# Patient Record
Sex: Male | Born: 1985 | Race: White | Hispanic: No | Marital: Single | Smoking: Current every day smoker
Health system: Southern US, Community
[De-identification: ages and names within clinical notes are randomized; demographics above are authoritative.]

## PROBLEM LIST (undated history)

## (undated) DIAGNOSIS — M1990 Unspecified osteoarthritis, unspecified site: Secondary | ICD-10-CM

## (undated) HISTORY — PX: APPENDECTOMY: SHX54

---

## 2012-12-24 ENCOUNTER — Encounter: Payer: Self-pay | Admitting: Family Medicine

## 2013-01-03 ENCOUNTER — Encounter: Payer: Self-pay | Admitting: Family Medicine

## 2014-05-13 ENCOUNTER — Ambulatory Visit: Payer: Self-pay | Admitting: Orthopedic Surgery

## 2016-08-03 ENCOUNTER — Encounter: Payer: Self-pay | Admitting: *Deleted

## 2016-08-03 ENCOUNTER — Emergency Department
Admission: EM | Admit: 2016-08-03 | Discharge: 2016-08-03 | Disposition: A | Discharge status: Home / Self Care | Payer: BLUE CROSS/BLUE SHIELD | Attending: Emergency Medicine | Admitting: Emergency Medicine

## 2016-08-03 ENCOUNTER — Emergency Department: Payer: BLUE CROSS/BLUE SHIELD

## 2016-08-03 DIAGNOSIS — S62521A Displaced fracture of distal phalanx of right thumb, initial encounter for closed fracture: Secondary | ICD-10-CM | POA: Diagnosis not present

## 2016-08-03 DIAGNOSIS — Y999 Unspecified external cause status: Secondary | ICD-10-CM | POA: Diagnosis not present

## 2016-08-03 DIAGNOSIS — Y9339 Activity, other involving climbing, rappelling and jumping off: Secondary | ICD-10-CM | POA: Diagnosis not present

## 2016-08-03 DIAGNOSIS — Y92488 Other paved roadways as the place of occurrence of the external cause: Secondary | ICD-10-CM | POA: Diagnosis not present

## 2016-08-03 DIAGNOSIS — X501XXA Overexertion from prolonged static or awkward postures, initial encounter: Secondary | ICD-10-CM | POA: Insufficient documentation

## 2016-08-03 DIAGNOSIS — S93401A Sprain of unspecified ligament of right ankle, initial encounter: Secondary | ICD-10-CM | POA: Insufficient documentation

## 2016-08-03 DIAGNOSIS — S6991XA Unspecified injury of right wrist, hand and finger(s), initial encounter: Secondary | ICD-10-CM | POA: Diagnosis present

## 2016-08-03 MED ORDER — NAPROXEN 500 MG PO TABS: 500.0000 mg | ORAL_TABLET | Freq: Two times a day (BID) | ORAL | 0 refills | Status: DC

## 2016-08-03 NOTE — ED Provider Notes (Signed)
[facility] Emergency Department Provider Note  ____________________________________________  Time seen: Approximately 10:49 AM  I have reviewed the triage vital signs and the nursing notes.   HISTORY  Chief Complaint Ankle Pain    HPI Kirk Larsen is a 30 y.o. male , NAD, presents emergency department for evaluation of right ankle pain and right thumb pain. Patient states he hurt his right thumb approximately one week ago. Is on certain of the specific injury but believes he may have hit something while he was working. Has had pain and bruising along the swelling about the distal portion of his thumb since that time. Denies any numbness, weakness or tingling. Has not noted any redness, bleeding or oozing. Also notes that he twisted his right ankle last night while jumping over an object in the road. States he came down and his lateral right ankle went down to the ground. Felt and heard a pop. Has noted swelling and bruising this morning. Denies any numbness, weakness or tingling of the lower extremity and has not noted any open wounds or lacerations.   History reviewed. No pertinent past medical history.  There are no active problems to display for this patient.   No past surgical history on file.  Prior to Admission medications   Medication Sig Start Date End Date Taking? Authorizing Provider  naproxen (NAPROSYN) 500 MG tablet Take 1 tablet (500 mg total) by mouth 2 (two) times daily with a meal. 08/03/16   Jalan Fariss L Kohl Polinsky, PA-C    Allergies Ivp dye [iodinated diagnostic agents]  History reviewed. No pertinent family history.  Social History Social History  Substance Use Topics  . Smoking status: Not on file  . Smokeless tobacco: Not on file  . Alcohol use Not on file     Review of Systems  Constitutional: No fever/chills Musculoskeletal: Positive right ankle and right thumb pain. Negative for back nor neck pain.  Skin: Positive bruising and  swelling right thumb and ankle. Negative for rash, redness, abnormal warmth, open wounds or lacerations. No oozing, weeping or bleeding. Neurological: Negative for numbness, weakness, tingling.   ____________________________________________   PHYSICAL EXAM:  VITAL SIGNS: ED Triage Vitals [08/03/16 0831]  Enc Vitals Group     BP (!) 156/88     Pulse Rate 71     Resp 18     Temp 98.4 F (36.9 C)     Temp Source Oral     SpO2 99 %     Weight 215 lb (97.5 kg)     Height 6\' 2"  (1.88 m)     Head Circumference      Peak Flow      Pain Score 8     Pain Loc      Pain Edu?      Excl. in GC?      Constitutional: Alert and oriented. Well appearing and in no acute distress. Eyes: Conjunctivae are normal without icterus or injection Head: Atraumatic. Cardiovascular: Good peripheral circulation with 2+ pulses noted in the right upper and lower extremities. Capillary refill is brisk in all digits of the right hand and foot. Respiratory: Normal respiratory effort without tachypnea or retractions.  Musculoskeletal: Tenderness to palpation diffusely about the middle and distal phalanx of the right thumb. Decreased range of motion of the PIP of the right thumb but full range of motion of the MCP. Patient is able to make a fist without difficulty. Diffuse swelling about the right ankle with mild bruising and tenderness  to palpation about the lateral and anterolateral surface. No bony abnormalities or crepitus. No laxity with anterior or posterior drawer. Laxity of varus stress. Full range of motion of the foot and toes without pain or difficulty. Neurologic:  Normal speech and language. No gross focal neurologic deficits are appreciated. Sensation to light touch grossly intact about the right upper and lower extremities. Skin:  Skin is warm, dry and intact. No rash noted. Psychiatric: Mood and affect are normal. Speech and behavior are normal. Patient exhibits appropriate insight and  judgement.   ____________________________________________   LABS  None ____________________________________________  EKG  None ____________________________________________  RADIOLOGY I, Ernestene KielJami L Garnet Chatmon, personally viewed and evaluated these images (plain radiographs) as part of my medical decision making, as well as reviewing the written report by the radiologist.  Dg Ankle Complete Right  Result Date: 08/03/2016 CLINICAL DATA:  Injured jumping, turning ankle with pain medially EXAM: RIGHT ANKLE - COMPLETE 3+ VIEW COMPARISON:  None. FINDINGS: The ankle joint appears normal. Alignment is normal. No fracture is seen. IMPRESSION: Negative. Electronically Signed   By: Dwyane DeePaul  Barry M.D.   On: 08/03/2016 08:56   Dg Finger Thumb Right  Result Date: 08/03/2016 CLINICAL DATA:  Hit something at work last week with pain EXAM: RIGHT THUMB 2+V COMPARISON:  None. FINDINGS: There is a nondisplaced fracture through the base of the distal phalanx of the right thumb with the fracture apparently extending to involve the right first DIP joint. No other acute abnormality is seen. Joint spaces appear normal. IMPRESSION: Nondisplaced fracture through the base of the distal phalanx of the right thumb which extends intra-articular. Electronically Signed   By: Dwyane DeePaul  Barry M.D.   On: 08/03/2016 08:57    ____________________________________________    PROCEDURES  Procedure(s) performed: None   Procedures   Medications - No data to display   ____________________________________________   INITIAL IMPRESSION / ASSESSMENT AND PLAN / ED COURSE  Pertinent labs & imaging results that were available during my care of the patient were reviewed by me and considered in my medical decision making (see chart for details).  Clinical Course     Patient's diagnosis is consistent with Right ankle sprain and closed displaced fracture of the distal phalanx of the right thumb. Patient's right ankle is placed in  Ace wrap and given crutches for comfort care. Right thumb was splinted in a metal finger splint. Patient will be discharged home with prescriptions for naproxen to take as directed. Patient is to follow up with Dr. Martha ClanKrasinski in orthopedics in 3-5 days for further evaluation and treatment of thumb fracture. Patient is given ED precautions to return to the ED for any worsening or new symptoms.    ____________________________________________  FINAL CLINICAL IMPRESSION(S) / ED DIAGNOSES  Final diagnoses:  Sprain of right ankle, unspecified ligament, initial encounter  Closed displaced fracture of distal phalanx of right thumb, initial encounter      NEW MEDICATIONS STARTED DURING THIS VISIT:  Discharge Medication List as of 08/03/2016  9:04 AM    START taking these medications   Details  naproxen (NAPROSYN) 500 MG tablet Take 1 tablet (500 mg total) by mouth 2 (two) times daily with a meal., Starting Wed 08/03/2016, Print             Ernestene KielJami L SimlaHagler, PA-C 08/03/16 1233    Emily FilbertJonathan E Williams, MD 08/03/16 [phone]

## 2016-08-03 NOTE — ED Triage Notes (Signed)
States he rolled his ankle last night, ambulatory to triage, using cane, states he hurt his thumb last week too

## 2016-08-03 NOTE — ED Notes (Signed)
Pt verbalized understanding of discharge teaching. 

## 2017-09-20 ENCOUNTER — Other Ambulatory Visit: Payer: Self-pay | Admitting: Orthopedic Surgery

## 2017-09-20 DIAGNOSIS — M23203 Derangement of unspecified medial meniscus due to old tear or injury, right knee: Secondary | ICD-10-CM

## 2017-09-26 ENCOUNTER — Ambulatory Visit
Admission: RE | Admit: 2017-09-26 | Discharge: 2017-09-26 | Disposition: A | Discharge status: Home / Self Care | Payer: BLUE CROSS/BLUE SHIELD | Source: Ambulatory Visit | Attending: Orthopedic Surgery | Admitting: Orthopedic Surgery

## 2017-09-26 DIAGNOSIS — M23203 Derangement of unspecified medial meniscus due to old tear or injury, right knee: Secondary | ICD-10-CM

## 2017-09-26 DIAGNOSIS — M23221 Derangement of posterior horn of medial meniscus due to old tear or injury, right knee: Secondary | ICD-10-CM | POA: Diagnosis not present

## 2017-09-26 DIAGNOSIS — R937 Abnormal findings on diagnostic imaging of other parts of musculoskeletal system: Secondary | ICD-10-CM | POA: Insufficient documentation

## 2017-10-31 ENCOUNTER — Encounter: Payer: Self-pay | Admitting: *Deleted

## 2017-10-31 ENCOUNTER — Other Ambulatory Visit: Payer: Self-pay

## 2017-11-07 ENCOUNTER — Ambulatory Visit
Admission: RE | Admit: 2017-11-07 | Discharge: 2017-11-07 | Disposition: A | Discharge status: Home / Self Care | Payer: BLUE CROSS/BLUE SHIELD | Source: Ambulatory Visit | Attending: Orthopedic Surgery | Admitting: Orthopedic Surgery

## 2017-11-07 ENCOUNTER — Ambulatory Visit: Payer: BLUE CROSS/BLUE SHIELD | Admitting: Anesthesiology

## 2017-11-07 ENCOUNTER — Encounter
Admission: RE | Disposition: A | Discharge status: Home / Self Care | Payer: Self-pay | Source: Ambulatory Visit | Attending: Orthopedic Surgery

## 2017-11-07 DIAGNOSIS — F17210 Nicotine dependence, cigarettes, uncomplicated: Secondary | ICD-10-CM | POA: Diagnosis not present

## 2017-11-07 DIAGNOSIS — M25561 Pain in right knee: Secondary | ICD-10-CM | POA: Diagnosis present

## 2017-11-07 DIAGNOSIS — X58XXXA Exposure to other specified factors, initial encounter: Secondary | ICD-10-CM | POA: Diagnosis not present

## 2017-11-07 DIAGNOSIS — S83231A Complex tear of medial meniscus, current injury, right knee, initial encounter: Secondary | ICD-10-CM | POA: Insufficient documentation

## 2017-11-07 HISTORY — PX: KNEE ARTHROSCOPY WITH MENISCAL REPAIR: SHX5653

## 2017-11-07 HISTORY — DX: Unspecified osteoarthritis, unspecified site: M19.90

## 2017-11-07 SURGERY — ARTHROSCOPY, KNEE, WITH MENISCUS REPAIR
Anesthesia: Regional | Laterality: Right | Wound class: Clean

## 2017-11-07 MED ORDER — LIDOCAINE-EPINEPHRINE 1 %-1:100000 IJ SOLN
INTRAMUSCULAR | Status: DC | PRN
  Administered 2017-11-07: 30 mL

## 2017-11-07 MED ORDER — OXYCODONE HCL 5 MG PO TABS: 5.0000 mg | ORAL_TABLET | Freq: Once | ORAL | Status: DC | PRN

## 2017-11-07 MED ORDER — OXYCODONE HCL 5 MG/5ML PO SOLN: 5.0000 mg | Freq: Once | ORAL | Status: DC | PRN

## 2017-11-07 MED ORDER — ROPIVACAINE HCL 5 MG/ML IJ SOLN
INTRAMUSCULAR | Status: DC | PRN
  Administered 2017-11-07: 30 mL via PERINEURAL

## 2017-11-07 MED ORDER — LIDOCAINE HCL (CARDIAC) 20 MG/ML IV SOLN
INTRAVENOUS | Status: DC | PRN
  Administered 2017-11-07: 80 mg via INTRATRACHEAL

## 2017-11-07 MED ORDER — LACTATED RINGERS IV SOLN
INTRAVENOUS | Status: DC
  Administered 2017-11-07: 09:00:00 via INTRAVENOUS

## 2017-11-07 MED ORDER — DEXAMETHASONE SODIUM PHOSPHATE 4 MG/ML IJ SOLN
INTRAMUSCULAR | Status: DC | PRN
  Administered 2017-11-07: 6 mg via INTRAVENOUS

## 2017-11-07 MED ORDER — FENTANYL CITRATE (PF) 100 MCG/2ML IJ SOLN
INTRAMUSCULAR | Status: DC | PRN
  Administered 2017-11-07 (×4): 25 ug via INTRAVENOUS

## 2017-11-07 MED ORDER — ONDANSETRON 4 MG PO TBDP: 4.0000 mg | ORAL_TABLET | Freq: Three times a day (TID) | ORAL | 0 refills | Status: DC | PRN

## 2017-11-07 MED ORDER — LACTATED RINGERS IV SOLN
INTRAVENOUS | Status: DC
  Administered 2017-11-07: 08:00:00 via INTRAVENOUS

## 2017-11-07 MED ORDER — HYDROCODONE-ACETAMINOPHEN 5-325 MG PO TABS: 1.0000 | ORAL_TABLET | ORAL | 0 refills | Status: DC | PRN

## 2017-11-07 MED ORDER — GLYCOPYRROLATE 0.2 MG/ML IJ SOLN
INTRAMUSCULAR | Status: DC | PRN
  Administered 2017-11-07: 0.1 mg via INTRAVENOUS

## 2017-11-07 MED ORDER — PROPOFOL 10 MG/ML IV BOLUS
INTRAVENOUS | Status: DC | PRN
  Administered 2017-11-07: 200 mg via INTRAVENOUS

## 2017-11-07 MED ORDER — BUPIVACAINE HCL 0.5 % IJ SOLN
INTRAMUSCULAR | Status: DC | PRN
  Administered 2017-11-07: 30 mL

## 2017-11-07 MED ORDER — ONDANSETRON HCL 4 MG/2ML IJ SOLN
INTRAMUSCULAR | Status: DC | PRN
  Administered 2017-11-07: 4 mg via INTRAVENOUS

## 2017-11-07 MED ORDER — ASPIRIN EC 325 MG PO TBEC: 325.0000 mg | DELAYED_RELEASE_TABLET | Freq: Every day | ORAL | 0 refills | Status: AC

## 2017-11-07 MED ORDER — IBUPROFEN 800 MG PO TABS: 800.0000 mg | ORAL_TABLET | Freq: Three times a day (TID) | ORAL | 0 refills | Status: AC

## 2017-11-07 MED ORDER — HYDROMORPHONE HCL 1 MG/ML IJ SOLN: 0.2500 mg | INTRAMUSCULAR | Status: DC | PRN

## 2017-11-07 MED ORDER — DEXTROSE 5 % IV SOLN
2000.0000 mg | Freq: Once | INTRAVENOUS | Status: AC
  Administered 2017-11-07: 2000 mg via INTRAVENOUS

## 2017-11-07 SURGICAL SUPPLY — 43 items
ADAPTER IRRIG TUBE 2 SPIKE SOL (ADAPTER) ×4 IMPLANT
BLADE SURG 15 STRL LF DISP TIS (BLADE) ×1 IMPLANT
BLADE SURG 15 STRL SS (BLADE) ×1
BLADE SURG SZ11 CARB STEEL (BLADE) ×2 IMPLANT
BNDG COHESIVE 4X5 TAN STRL (GAUZE/BANDAGES/DRESSINGS) ×2 IMPLANT
BNDG ESMARK 6X12 TAN STRL LF (GAUZE/BANDAGES/DRESSINGS) IMPLANT
BRACE KNEE POST OP SHORT (BRACE) IMPLANT
BUR RADIUS 3.5 (BURR) ×2 IMPLANT
BUR RADIUS 4.0X18.5 (BURR) IMPLANT
CHLORAPREP W/TINT 26ML (MISCELLANEOUS) ×2 IMPLANT
CLEANER CAUTERY TIP 5X5 PAD (MISCELLANEOUS) ×1 IMPLANT
COOLER POLAR GLACIER W/PUMP (MISCELLANEOUS) ×2 IMPLANT
COVER LIGHT HANDLE UNIVERSAL (MISCELLANEOUS) ×4 IMPLANT
CUFF TOURN SGL QUICK 24 (TOURNIQUET CUFF) ×1
CUFF TOURN SGL QUICK 30 (MISCELLANEOUS)
CUFF TOURN SGL QUICK 34 (TOURNIQUET CUFF)
CUFF TRNQT CYL 24X4X40X1 (TOURNIQUET CUFF) ×1 IMPLANT
CUFF TRNQT CYL 34X4X40X1 (TOURNIQUET CUFF) IMPLANT
CUFF TRNQT CYL LO 30X4X (MISCELLANEOUS) IMPLANT
DRAPE IMP U-DRAPE 54X76 (DRAPES) ×2 IMPLANT
GAUZE SPONGE 4X4 12PLY STRL (GAUZE/BANDAGES/DRESSINGS) ×2 IMPLANT
GLOVE BIO SURGEON STRL SZ7.5 (GLOVE) ×2 IMPLANT
GLOVE BIOGEL PI IND STRL 8 (GLOVE) ×1 IMPLANT
GLOVE BIOGEL PI INDICATOR 8 (GLOVE) ×1
GOWN STRL REUS W/ TWL LRG LVL3 (GOWN DISPOSABLE) ×1 IMPLANT
GOWN STRL REUS W/TWL LRG LVL3 (GOWN DISPOSABLE) ×3 IMPLANT
IV LACTATED RINGER IRRG 3000ML (IV SOLUTION) ×4
IV LR IRRIG 3000ML ARTHROMATIC (IV SOLUTION) ×4 IMPLANT
KIT TURNOVER KIT A (KITS) ×2 IMPLANT
MAT BLUE FLOOR 46X72 FLO (MISCELLANEOUS) ×2 IMPLANT
NEPTUNE MANIFOLD (MISCELLANEOUS) ×2 IMPLANT
PACK ARTHROSCOPY KNEE (MISCELLANEOUS) ×2 IMPLANT
PAD CLEANER CAUTERY TIP 5X5 (MISCELLANEOUS) ×1
PAD WRAPON POLAR KNEE (MISCELLANEOUS) ×1 IMPLANT
PENCIL ELECTRO HAND CTR (MISCELLANEOUS) IMPLANT
SET TUBE SUCT SHAVER OUTFL 24K (TUBING) ×2 IMPLANT
SET TUBE TIP INTRA-ARTICULAR (MISCELLANEOUS) ×2 IMPLANT
SUT ETHILON 3-0 KS 30 BLK (SUTURE) ×2 IMPLANT
TOWEL OR 17X26 4PK STRL BLUE (TOWEL DISPOSABLE) ×4 IMPLANT
TUBING ARTHRO INFLOW-ONLY STRL (TUBING) ×2 IMPLANT
WAND HAND CNTRL MULTIVAC 50 (MISCELLANEOUS) IMPLANT
WAND HAND CNTRL MULTIVAC 90 (MISCELLANEOUS) IMPLANT
WRAPON POLAR PAD KNEE (MISCELLANEOUS) ×2

## 2017-11-07 NOTE — Anesthesia Preprocedure Evaluation (Signed)
Anesthesia Evaluation  Patient identified by MRN, date of birth, ID band Patient awake    Reviewed: Allergy & Precautions, H&P , NPO status , Patient's Chart, lab work & pertinent test results  History of Anesthesia Complications Negative for: history of anesthetic complications  Airway Mallampati: I  TM Distance: >3 FB Neck ROM: full    Dental no notable dental hx.    Pulmonary Current Smoker,    Pulmonary exam normal        Cardiovascular negative cardio ROS Normal cardiovascular exam     Neuro/Psych    GI/Hepatic negative GI ROS, Neg liver ROS,   Endo/Other  negative endocrine ROS  Renal/GU negative Renal ROS     Musculoskeletal   Abdominal   Peds  Hematology negative hematology ROS (+)   Anesthesia Other Findings   Reproductive/Obstetrics                            Anesthesia Physical Anesthesia Plan  ASA: II  Anesthesia Plan: General LMA and Regional   Post-op Pain Management: GA combined w/ Regional for post-op pain   Induction:   PONV Risk Score and Plan:   Airway Management Planned:   Additional Equipment:   Intra-op Plan:   Post-operative Plan:   Informed Consent: I have reviewed the patients History and Physical, chart, labs and discussed the procedure including the risks, benefits and alternatives for the proposed anesthesia with the patient or authorized representative who has indicated his/her understanding and acceptance.     Plan Discussed with:   Anesthesia Plan Comments:         Anesthesia Quick Evaluation

## 2017-11-07 NOTE — H&P (Signed)
Paper H&P to be scanned into permanent record. H&P reviewed. No significant changes noted.  

## 2017-11-07 NOTE — Transfer of Care (Signed)
Immediate Anesthesia Transfer of Care Note  Patient: Kirk Larsen  Procedure(s) Performed: KNEE ARTHROSCOPY WITH MEDIAL MENISCAL REPAIR AND PARTIAL MENISCECTOMY WITH CHONDROPLASTY (Right )  Patient Location: PACU  Anesthesia Type: General LMA, Regional  Level of Consciousness: awake, alert  and patient cooperative  Airway and Oxygen Therapy: Patient Spontanous Breathing and Patient connected to supplemental oxygen  Post-op Assessment: Post-op Vital signs reviewed, Patient's Cardiovascular Status Stable, Respiratory Function Stable, Patent Airway and No signs of Nausea or vomiting  Post-op Vital Signs: Reviewed and stable  Complications: No apparent anesthesia complications

## 2017-11-07 NOTE — Discharge Instructions (Signed)
Arthroscopic Knee Surgery - Partial Meniscectomy °  °Post-Op Instructions °  °1. Bracing or crutches: Crutches will be provided at the time of discharge from the surgery center if you do not already have them. °  °2. Ice: You may be provided with a device (Polar Care) that allows you to ice the affected area effectively. Otherwise you can ice manually.  °  °3. Driving:  Plan on not driving for at least two weeks. Please note that you are advised NOT to drive while taking narcotic pain medications as you may be impaired and unsafe to drive. °  °4. Activity: Ankle pumps several times an hour while awake to prevent blood clots. Weight bearing: as tolerated. Use crutches for as needed (usually ~1 week or less) until pain allows you to ambulate without a limp. Bending and straightening the knee is unlimited. Elevate knee above heart level as much as possible for one week. Avoid standing more than 5 minutes (consecutively) for the first week.  Avoid long distance travel for 2 weeks. °  °5. Medications:  °- You have been provided a prescription for narcotic pain medicine. After surgery, take 1-2 narcotic tablets every 4 hours if needed for severe pain.  °- A prescription for anti-nausea medication will be provided in case the narcotic medicine causes nausea - take 1 tablet every 6 hours only if nauseated.  °- Take ibuprofen 800 mg every 8 hours WITH food to reduce post-operative knee swelling. DO NOT STOP IBUPROFEN POST-OP UNTIL INSTRUCTED TO DO SO at first post-op office visit (10-14 days after surgery). However, please discontinue if you have any abdominal discomfort after taking this.  °- Take enteric coated aspirin 325 mg once daily for 2 weeks to prevent blood clots.  ° ° °6. Bandages: The physical therapist should change the bandages at the first post-op appointment. If needed, the dressing supplies have been provided to you. °  °7. Physical Therapy: 1-2 times per week for 6 weeks. Therapy typically starts on post  operative Day 3 or 4. You have been provided an order for physical therapy. The therapist will provide home exercises. °  °8. Work: May return to full work usually around 2 weeks after 1st post-operative visit. May do light duty/desk job in approximately 1-2 weeks when off of narcotics, pain is well-controlled, and swelling has decreased. Labor intensive jobs may require 4-6 weeks to return.  °  °9. Post-Op Appointments: °Your first post-op appointment will be with Dr. Keeley Sussman in approximately 2 weeks time.  °  °If you find that they have not been scheduled please call the Orthopaedic Appointment front desk at [phone]. ° ° °General Anesthesia, Adult, Care After °These instructions provide you with information about caring for yourself after your procedure. Your health care provider may also give you more specific instructions. Your treatment has been planned according to current medical practices, but problems sometimes occur. Call your health care provider if you have any problems or questions after your procedure. °What can I expect after the procedure? °After the procedure, it is common to have: °· Vomiting. °· A sore throat. °· Mental slowness. ° °It is common to feel: °· Nauseous. °· Cold or shivery. °· Sleepy. °· Tired. °· Sore or achy, even in parts of your body where you did not have surgery. ° °Follow these instructions at home: °For at least 24 hours after the procedure: °· Do not: °? Participate in activities where you could fall or become injured. °? Drive. °? Use heavy machinery. °? Drink alcohol. °? Take sleeping pills or medicines   that cause drowsiness. °? Make important decisions or sign legal documents. °? Take care of children on your own. °· Rest. °Eating and drinking °· If you vomit, drink water, juice, or soup when you can drink without vomiting. °· Drink enough fluid to keep your urine clear or pale yellow. °· Make sure you have little or no nausea before eating solid foods. °· Follow the  diet recommended by your health care provider. °General instructions °· Have a responsible adult stay with you until you are awake and alert. °· Return to your normal activities as told by your health care provider. Ask your health care provider what activities are safe for you. °· Take over-the-counter and prescription medicines only as told by your health care provider. °· If you smoke, do not smoke without supervision. °· Keep all follow-up visits as told by your health care provider. This is important. °Contact a health care provider if: °· You continue to have nausea or vomiting at home, and medicines are not helpful. °· You cannot drink fluids or start eating again. °· You cannot urinate after 8-12 hours. °· You develop a skin rash. °· You have fever. °· You have increasing redness at the site of your procedure. °Get help right away if: °· You have difficulty breathing. °· You have chest pain. °· You have unexpected bleeding. °· You feel that you are having a life-threatening or urgent problem. °This information is not intended to replace advice given to you by your health care provider. Make sure you discuss any questions you have with your health care provider. °Document Released: 11/28/2000 Document Revised: 01/25/2016 Document Reviewed: 08/06/2015 °Elsevier Interactive Patient Education © 2018 [employer] ° °

## 2017-11-07 NOTE — Anesthesia Procedure Notes (Signed)
Anesthesia Regional Block: Adductor canal block   Pre-Anesthetic Checklist: ,, timeout performed, Correct Patient, Correct Site, Correct Laterality, Correct Procedure, Correct Position, site marked, Risks and benefits discussed,  Surgical consent,  Pre-op evaluation,  At surgeon's request and post-op pain management  Laterality: Right  Prep: chloraprep       Needles:  Injection technique: Single-shot  Needle Type: Echogenic Needle     Needle Length: 9cm  Needle Gauge: 21     Additional Needles:   Procedures:,,,, ultrasound used (permanent image in chart),,,,  Narrative:  Start time: 11/07/2017 8:15 AM End time: 11/07/2017 8:30 AM Injection made incrementally with aspirations every 5 mL.  Performed by: Personally  Anesthesiologist: Jolayne Pantherunkle, Javarus Dorner, MD  Additional Notes: Functioning IV was confirmed and monitors applied. Ultrasound guidance: relevant anatomy identified, needle position confirmed, local anesthetic spread visualized around nerve(s)., vascular puncture avoided.  Image printed for medical record.  Negative aspiration and no paresthesias; incremental administration of local anesthetic. The patient tolerated the procedure well. Vitals signes recorded in RN notes.

## 2017-11-07 NOTE — Anesthesia Procedure Notes (Signed)
Procedure Name: LMA Insertion Date/Time: 11/07/2017 9:00 AM Performed by: Orlin HildingLeblanc, Quamere Mussell, CRNA Pre-anesthesia Checklist: Patient identified, Patient being monitored, Timeout performed, Emergency Drugs available and Suction available Patient Re-evaluated:Patient Re-evaluated prior to induction Oxygen Delivery Method: Circle system utilized Preoxygenation: Pre-oxygenation with 100% oxygen Induction Type: IV induction Ventilation: Mask ventilation without difficulty LMA: LMA inserted LMA Size: 4.0 Tube type: Oral Number of attempts: 1 Placement Confirmation: positive ETCO2 and breath sounds checked- equal and bilateral Tube secured with: Tape Dental Injury: Teeth and Oropharynx as per pre-operative assessment

## 2017-11-07 NOTE — Op Note (Signed)
Operative Note   SURGERY DATE: 11/07/2017  PRE-OP DIAGNOSIS:  1. Right medial meniscus tear  POST-OP DIAGNOSIS: 1. Right medial meniscus tear 2. Degenerative changes of patella  PROCEDURES:  1. Right knee arthroscopy, partial medial meniscectomy 2. Chondroplasty of patellofemoral compartment  SURGEON: Rosealee AlbeeSunny H. Kalani Sthilaire, MD  ANESTHESIA: Gen  ESTIMATED BLOOD LOSS:minimal  TOTAL IV FLUIDS: per anesthesia  INDICATION(S):  Kirk Larsen is a 32 y.o. male with signs and symptoms as well as MRI finding of medial meniscus tear. Meniscus tear was initially diagnosed in 2015, but he never underwent surgery due to improvement in symptoms. He reinjured his knee recently and updated MRI redemonstrated tear with more significant involvement of meniscus. We discussed that I would attempt to repair the meniscus, if at all possible, but there was a significant chance that the meniscus would be irreparable given the chronicity of tear. After discussion of risks, benefits, and alternatives to surgery, the patient elected to proceed.  OPERATIVE FINDINGS:   Examination under anesthesia:A careful examination under anesthesia was performed. Passive range of motion was: Hyperextension: 1. Extension: 0. Flexion: 140. Lachman: normal. Pivot Shift: normal. Posterior drawer: normal. Varus stability in full extension: normal. Varus stability in 30 degrees of flexion: normal. Valgus stability in full extension: normal. Valgus stability in 30 degrees of flexion: normal.  Intra-operative findings:A thorough arthroscopic examination of the knee was performed. The findings are: 1. Suprapatellar pouch: Normal 2. Undersurface of median ridge: Normal 3. Medial patellar facet: Focal Grade 2 degenerative changes at medial border 4. Lateral patellar facet: Normal 5. Trochlea: Normal 6. Lateral gutter/popliteus tendon: Normal 7. Hoffa's fat pad: Inflamed 8. Medial gutter/plica: Flipped fragment of  meniscus in medial gutter 9. ACL: Normal 10. PCL: Normal 11. Medial meniscus: Complex tear with both vertical abd horizontal components of the posterior horn with fragment flipped into medial gutter 12. Medial compartment cartilage: Grade 1 degenerative changes of both femoral condyle and tibial plateau 13. Lateral meniscus: Normal 14. Lateral compartment cartilage: Normal  OPERATIVE REPORT:   I identified Kirk Galarik Kaulinisin the pre-operative holding area. I marked theoperativeknee with my initials. I reviewed the risks and benefits of the proposed surgical intervention and the patient (and/or patient's guardian) wished to proceed. The patient was transferred to the operative suite and placed in the supine position with all bony prominences padded. Anesthesia was administered. Appropriate IV antibioticswere administered prior to incision. The extremity was then prepped and draped in standard fashion. A time out was performed confirming the correct extremity, correct patient, and correct procedure.  Arthroscopy portals were marked. Local anesthetic was injected to the planned portal sites. The anterolateral portalwasestablished with an 11blade.   The arthroscope was placed in the anterolateral portal and theninto the suprapatellar pouch. A diagnostic knee scope was completed with the above findings. The medial meniscus tear was identified.  Next the medial portal was established under needle localization. The MCL was pie-crusted to improve visualization of the posterior horn. The meniscal tear was flipped back into the joint and removed from the joint with a combination of a biter and grasper. The remainder of the posterior horn of the medial meniscus was debrided using an arthroscopic biter and an oscillating shaver until the meniscus had stable borders. There was still a significant amount of meniscus rim left. A chondroplasty was performed of the medial patella such that there were  stable cartilage edges without any loose fragments of cartilage. Arthroscopic fluid was removed from the joint.  The portals were closed with 3-0 Nylon  suture. Sterile dressings included Xeroform, 4x4s, Sof-Rol, and Bias wrap. A Polarcare was placed.  The patient was then awakened and taken to the PACU hemodynamically stable without complication.   POSTOPERATIVE PLAN: The patient will be discharged home today once they meet PACU criteria. Aspirin 325 mg daily was prescribed for 2 weeks for DVT prophylaxis. Physical therapy will start on POD#3-4.Weight-bearing as tolerated. They will follow up in 2 weeks per protocol.

## 2017-11-07 NOTE — Anesthesia Postprocedure Evaluation (Signed)
Anesthesia Post Note  Patient: Kirk Larsen  Procedure(s) Performed: KNEE ARTHROSCOPY WITH MEDIAL MENISCAL REPAIR AND PARTIAL MENISCECTOMY WITH CHONDROPLASTY (Right )  Patient location during evaluation: PACU Anesthesia Type: Regional Level of consciousness: awake and alert Pain management: pain level controlled Vital Signs Assessment: post-procedure vital signs reviewed and stable Respiratory status: spontaneous breathing Cardiovascular status: blood pressure returned to baseline Postop Assessment: no headache Anesthetic complications: no    Verner Cholunkle, III,  Akasia Ahmad D

## 2020-12-01 ENCOUNTER — Encounter: Payer: Self-pay | Admitting: Emergency Medicine

## 2020-12-01 ENCOUNTER — Emergency Department: Payer: Medicaid Other

## 2020-12-01 ENCOUNTER — Other Ambulatory Visit: Payer: Self-pay

## 2020-12-01 ENCOUNTER — Emergency Department
Admission: EM | Admit: 2020-12-01 | Discharge: 2020-12-01 | Disposition: A | Discharge status: Home / Self Care | Payer: Medicaid Other | Attending: Emergency Medicine | Admitting: Emergency Medicine

## 2020-12-01 DIAGNOSIS — S63641A Sprain of metacarpophalangeal joint of right thumb, initial encounter: Secondary | ICD-10-CM | POA: Insufficient documentation

## 2020-12-01 DIAGNOSIS — F17210 Nicotine dependence, cigarettes, uncomplicated: Secondary | ICD-10-CM | POA: Diagnosis not present

## 2020-12-01 DIAGNOSIS — X58XXXA Exposure to other specified factors, initial encounter: Secondary | ICD-10-CM | POA: Diagnosis not present

## 2020-12-01 DIAGNOSIS — S6991XA Unspecified injury of right wrist, hand and finger(s), initial encounter: Secondary | ICD-10-CM | POA: Diagnosis present

## 2020-12-01 NOTE — ED Triage Notes (Signed)
Patient presents to the ED with painful thumb on right hand.  Patient states thumb has been hurting for approx. 2-3 weeks when he accidentally injured it.  Patient states he has been wearing a thumb splint but today, re injured his thumb and now the pain is very severe.

## 2020-12-01 NOTE — ED Notes (Signed)
See triage note  Presents with right thumb pain  States he bent back his thumb about 2 weeks ago  And re injured at today

## 2020-12-01 NOTE — Discharge Instructions (Addendum)
Your exam is negative for any acute fracture.  You do have a sprain to the thumb, concerning for a "skiers thumb."  Wear the thumb brace as directed.  You may take over-the-counter Tylenol Motrin needed for pain.  Follow-up with [facility] for ongoing symptom management.

## 2020-12-01 NOTE — ED Provider Notes (Signed)
[facility] Emergency Department Provider Note ____________________________________________  Time seen: 1338  I have reviewed the triage vital signs and the nursing notes.  HISTORY  Chief Complaint  Hand Pain  HPI Kirk Larsen is a 35 y.o. male presents himself to the ED for evaluation of right thumb pain on the right hand.  Patient with report of right hand injury about 3 weeks prior, when he landed on a tented right hand.  He describes a hyperextension injury to the right thumb at that time.  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  He presents today noting continued pain and disability to the right thumb.  He is not sought care with any other provider at this time.  Past Medical History:  Diagnosis Date  . Arthritis    hips, knees    There are no problems to display for this patient.   Past Surgical History:  Procedure Laterality Date  . APPENDECTOMY    . KNEE ARTHROSCOPY WITH MENISCAL REPAIR Right 11/07/2017   Procedure: KNEE ARTHROSCOPY WITH MEDIAL MENISCAL REPAIR AND PARTIAL MENISCECTOMY WITH CHONDROPLASTY;  Surgeon: Signa Kell, MD;  Location: [facility];  Service: Orthopedics;  Laterality: Right;  [place]  - STYKER CETERIX, AIR,  SHARPSHOOTER BRAG BRACE SUPINE WITH ACL LEG HOLDER    Prior to Admission medications   Not on File    Allergies Ivp dye [iodinated diagnostic agents]  History reviewed. No pertinent family history.  Social History Social History   Tobacco Use  . Smoking status: Current Every Day Smoker    Packs/day: 0.50    Years: 10.00    Pack years: 5.00    Types: Cigarettes  . Smokeless tobacco: Never Used  Vaping Use  . Vaping Use: Never used  Substance Use Topics  . Alcohol use: Yes    Alcohol/week: 8.0 standard drinks    Types: 8 Cans of beer per week    Review of Systems  Constitutional: Negative for fever. Cardiovascular: Negative  for chest pain. Respiratory: Negative for shortness of breath. Gastrointestinal: Negative for abdominal pain, vomiting and diarrhea. Genitourinary: Negative for dysuria. Musculoskeletal: Negative for back pain.  Right thumb pain as above. Skin: Negative for rash. Neurological: Negative for headaches, focal weakness or numbness. ____________________________________________  PHYSICAL EXAM:  VITAL SIGNS: ED Triage Vitals  Enc Vitals Group     BP 12/01/20 1310 (!) 148/87     Pulse Rate 12/01/20 1310 82     Resp 12/01/20 1310 18     Temp 12/01/20 1310 97.9 F (36.6 C)     Temp Source 12/01/20 1310 Oral     SpO2 12/01/20 1310 99 %     Weight 12/01/20 1311 228 lb (103.4 kg)     Height 12/01/20 1311 6\' 2"  (1.88 m)     Head Circumference --      Peak Flow --      Pain Score 12/01/20 1310 8     Pain Loc --      Pain Edu? --      Excl. in GC? --     Constitutional: Alert and oriented. Well appearing and in no distress. Head: Normocephalic and atraumatic. Eyes: Conjunctivae are normal. Normal extraocular movements  Cardiovascular: Normal rate, regular rhythm. Normal distal pulses and cap refill. Respiratory: Normal respiratory effort.  Musculoskeletal: Right thumb and hand without obvious deformity or dislocation.  Patient able demonstrate normal composite fist on the right.  He is noting pain to the right MCP joint.  Pain is increased with ulnar and radial stress stressing the same joint.  Some slight laxity is appreciated with ulnar & radial shift.  Nontender with normal range of motion in all extremities.  Neurologic: Cranial nerves II to XII grossly intact.  Normal gross sensation.  Normal speech and language. No gross focal neurologic deficits are appreciated. Skin:  Skin is warm, dry and intact. No rash noted. __________________________________________   RADIOLOGY  DG Right Hand  IMPRESSION: Negative.  I, Lissa Hoard, personally viewed and evaluated these images  (plain radiographs) as part of my medical decision making, as well as reviewing the written report by the radiologist. ____________________________________________  PROCEDURES  Abducted thumb spica splint  Procedures ____________________________________________  INITIAL IMPRESSION / ASSESSMENT AND PLAN / ED COURSE  DDX: thumb sprain, thumb fracture, thumb tendinitis  Patient ED evaluation of thumb injury and pain after mechanical injury 7 to 3 weeks prior.  Patient's clinical picture is concerning for possible collateral ligament strain or tear but he is placed in a thumb spica splint for comfort.  No radiologic evidence of acute fracture or dislocation.  Patient is referred to Ortho for ongoing management of his symptoms.  Return precautions have been discussed.   Kirk Larsen was evaluated in Emergency Department on 12/01/2020 for the symptoms described in the history of present illness. He was evaluated in the context of the global COVID-19 pandemic, which necessitated consideration that the patient might be at risk for infection with the SARS-CoV-2 virus that causes COVID-19. Institutional protocols and algorithms that pertain to the evaluation of patients at risk for COVID-19 are in a state of rapid change based on information released by regulatory bodies including the CDC and federal and state organizations. These policies and algorithms were followed during the patient's care in the ED. ____________________________________________  FINAL CLINICAL IMPRESSION(S) / ED DIAGNOSES  Final diagnoses:  Sprain of metacarpophalangeal (MCP) joint of right thumb, initial encounter      Lissa Hoard, PA-C 12/01/20 1924    Minna Antis, MD 12/02/20 1455

## 2022-11-13 IMAGING — DX DG FINGER THUMB 2+V*R*
3 series · 3 of 3 positions shown · non-contrast
Comparison: None.

CLINICAL DATA: Right thumb pain for 2-3 weeks

EXAM:
RIGHT THUMB 2+V

[finger ap]
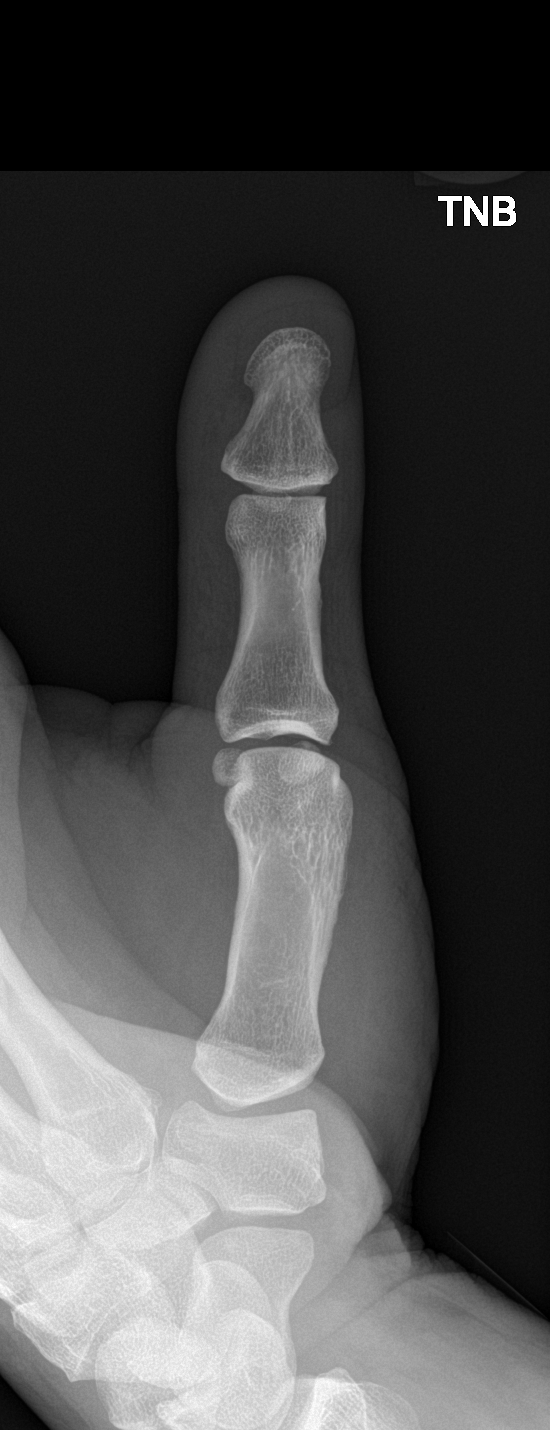

[finger obl]
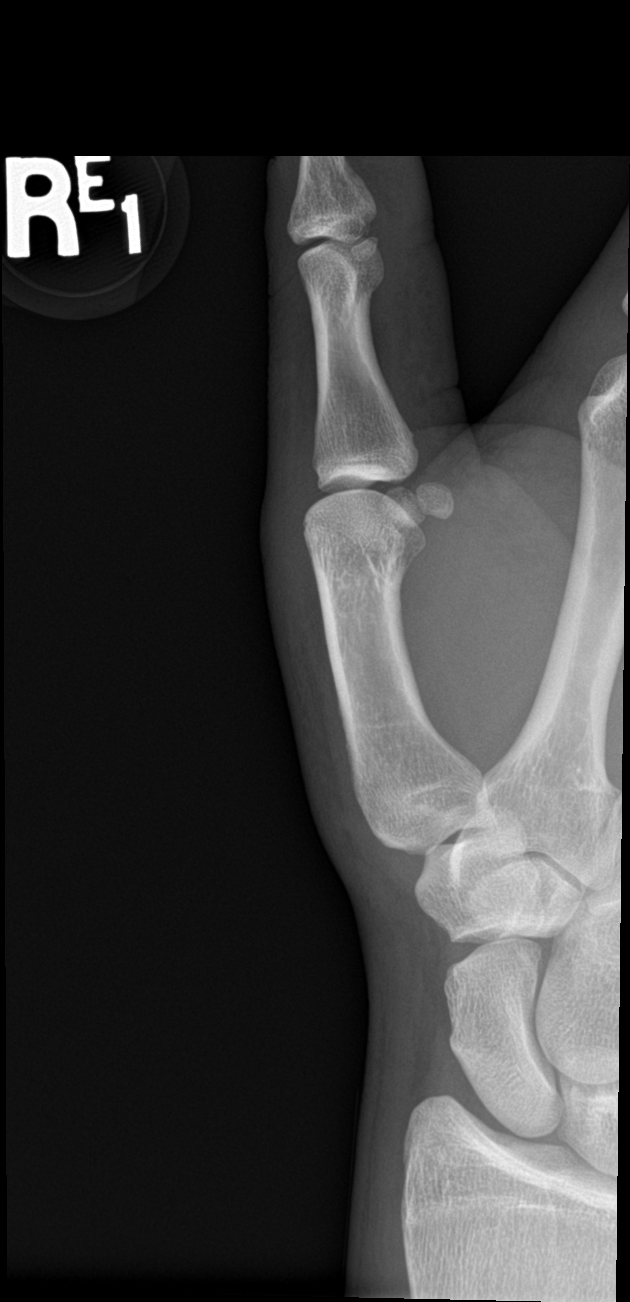

[finger lat]
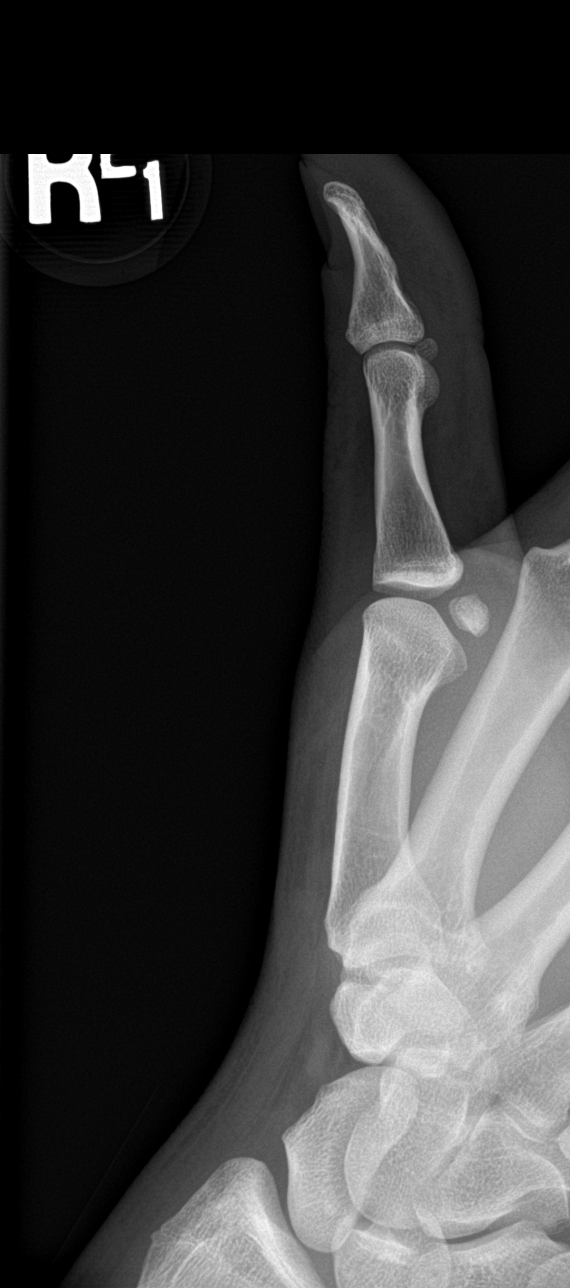

[3 of 3 positions shown; findings below may reference images not displayed]

FINDINGS: There is no evidence of fracture or dislocation. There is no
evidence of arthropathy or other focal bone abnormality. Soft
tissues are unremarkable.
IMPRESSION: Negative.
